# Patient Record
Sex: Male | Born: 1970 | Race: White | Hispanic: No | Marital: Married
Health system: Southern US, Community
[De-identification: ages and names within clinical notes are randomized; demographics above are authoritative.]

---

## 1999-03-01 ENCOUNTER — Emergency Department (HOSPITAL_COMMUNITY): Admission: EM | Admit: 1999-03-01 | Discharge: 1999-03-01 | Payer: Self-pay | Admitting: Emergency Medicine

## 1999-03-23 ENCOUNTER — Encounter: Admission: RE | Admit: 1999-03-23 | Discharge: 1999-06-21 | Payer: Self-pay

## 2009-11-16 ENCOUNTER — Emergency Department (HOSPITAL_BASED_OUTPATIENT_CLINIC_OR_DEPARTMENT_OTHER): Admission: EM | Admit: 2009-11-16 | Discharge: 2009-11-16 | Payer: Self-pay | Admitting: Emergency Medicine

## 2009-11-16 ENCOUNTER — Ambulatory Visit: Payer: Self-pay | Admitting: Diagnostic Radiology

## 2010-10-11 LAB — COMPREHENSIVE METABOLIC PANEL
ALT: 25 U/L (ref 0–53)
AST: 29 U/L (ref 0–37)
CO2: 27 mEq/L (ref 19–32)
Chloride: 99 mEq/L (ref 96–112)
GFR calc non Af Amer: >60 mL/min (ref >60)
Potassium: 4.7 mEq/L (ref 3.5–5.1)
Sodium: 139 mEq/L (ref 135–145)
Total Bilirubin: 1 mg/dL (ref 0.3–1.2)
Total Protein: 8.1 g/dL (ref 6.0–8.3)

## 2010-10-11 LAB — CBC
HCT: 42.9 % (ref 39.0–52.0)
Hemoglobin: 14.5 g/dL (ref 13.0–17.0)
MCHC: 33.9 g/dL (ref 30.0–36.0)
RBC: 4.86 MIL/uL (ref 4.22–5.81)

## 2010-10-11 LAB — URINALYSIS, ROUTINE W REFLEX MICROSCOPIC
Bilirubin Urine: NEGATIVE
Ketones, ur: 15 mg/dL — AB
Nitrite: NEGATIVE
Protein, ur: NEGATIVE mg/dL
Specific Gravity, Urine: 1.031 — ABNORMAL HIGH (ref 1.005–1.030)
pH: 6.5 (ref 5.0–8.0)

## 2010-10-11 LAB — GLUCOSE, CAPILLARY: Glucose-Capillary: 177 mg/dL — ABNORMAL HIGH (ref 70–99)

## 2010-10-11 LAB — DIFFERENTIAL
Eosinophils Absolute: 0 10*3/uL (ref 0.0–0.7)
Eosinophils Relative: 0 % (ref 0–5)
Lymphocytes Relative: 8 % — ABNORMAL LOW (ref 12–46)
Lymphs Abs: 0.8 10*3/uL (ref 0.7–4.0)
Monocytes Absolute: 0.7 10*3/uL (ref 0.1–1.0)
Monocytes Relative: 8 % (ref 3–12)

## 2010-10-11 LAB — LIPASE, BLOOD: Lipase: 56 U/L (ref 23–300)

## 2011-01-28 IMAGING — CT CT ABD-PELV W/ CM
2 of 4 series · 17 of 46 positions shown, 19 images · IV contrast (APPLIED)
Comparison: None.

CLINICAL DATA: Right upper quadrant abdominal pain.  Nausea
vomiting.  History hypertension and diabetes.

CT ABDOMEN AND PELVIS WITH CONTRAST
TECHNIQUE: Multidetector CT imaging of the abdomen and pelvis was
performed following the standard protocol during bolus
administration of intravenous contrast.
Contrast: 100  ml [id number]

[Series 2: abd/pelvis 5.0 b31f · axial · 0.80mm/px · z∈[-438,+7]mm · 14 of 99 slices shown, 16 images]
[im 5/99  soft-tissue]
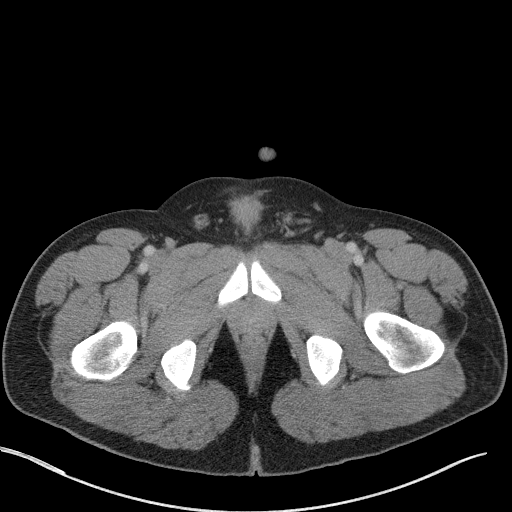
[im 5/99  bone]
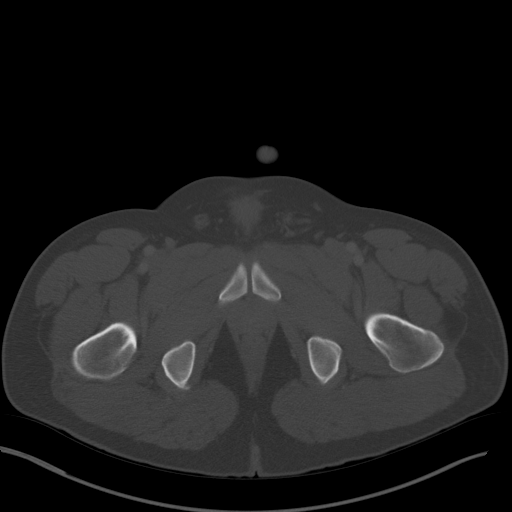
[im 13/99  soft-tissue]
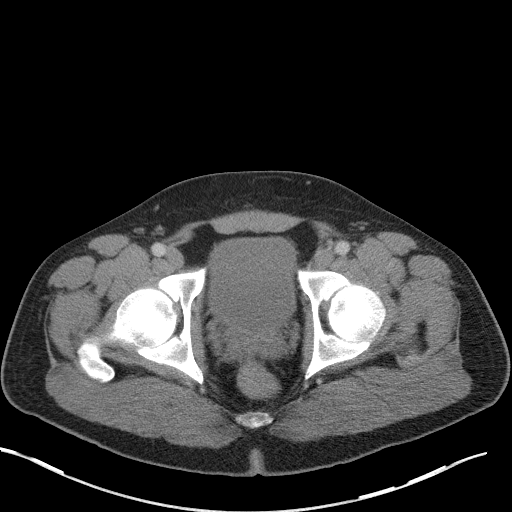
[im 18/99  soft-tissue]
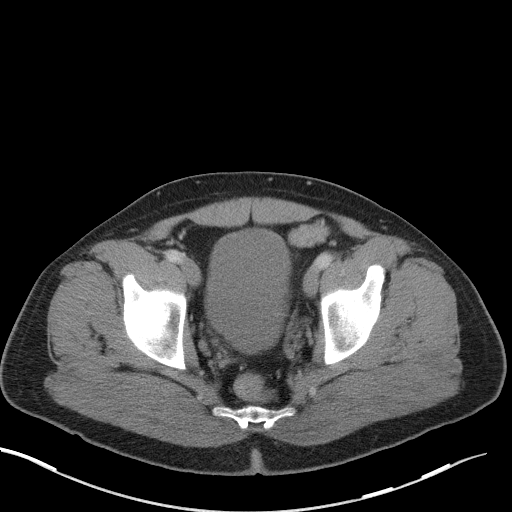
[im 26/99  soft-tissue]
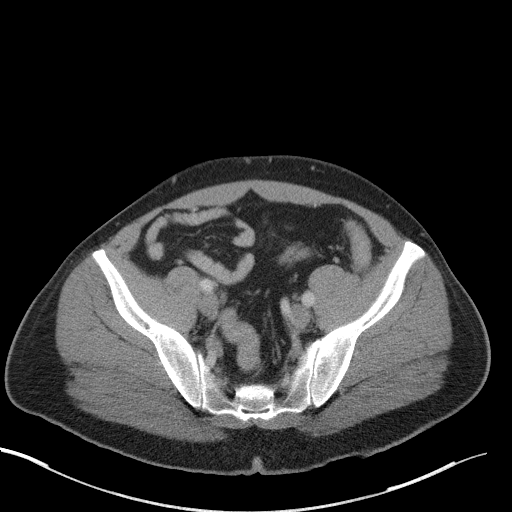
[im 35/99  soft-tissue]
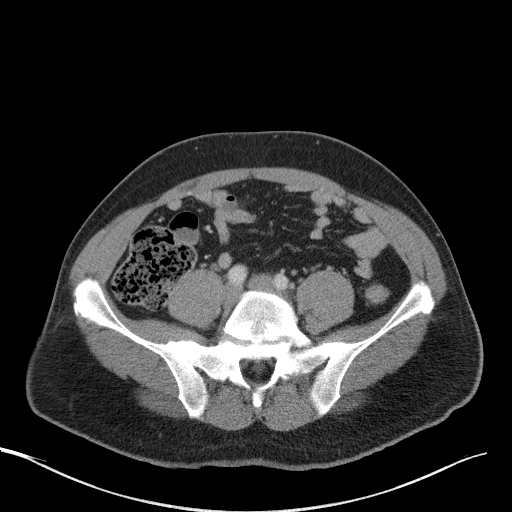
[im 39/99  soft-tissue]
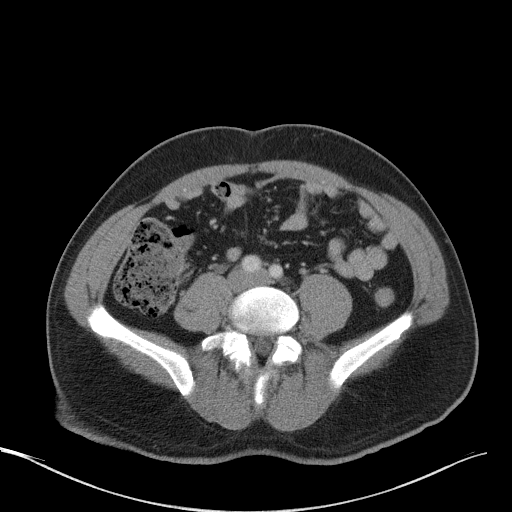
[im 47/99  soft-tissue]
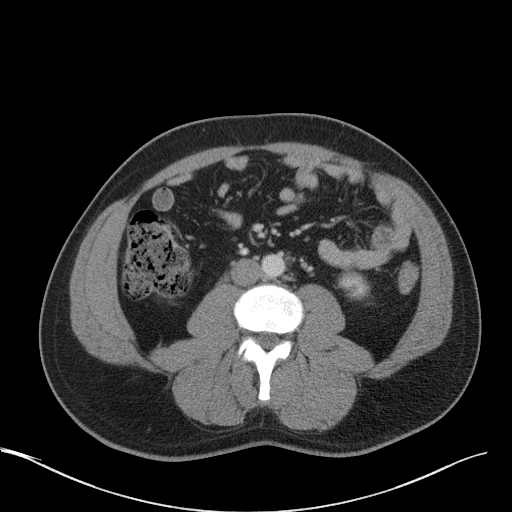
[im 52/99  soft-tissue]
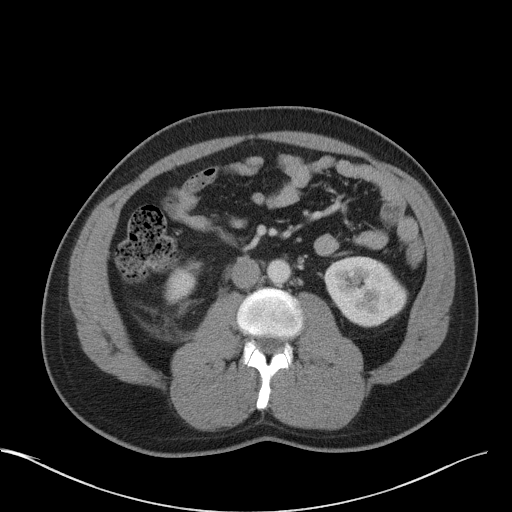
[im 60/99  soft-tissue]
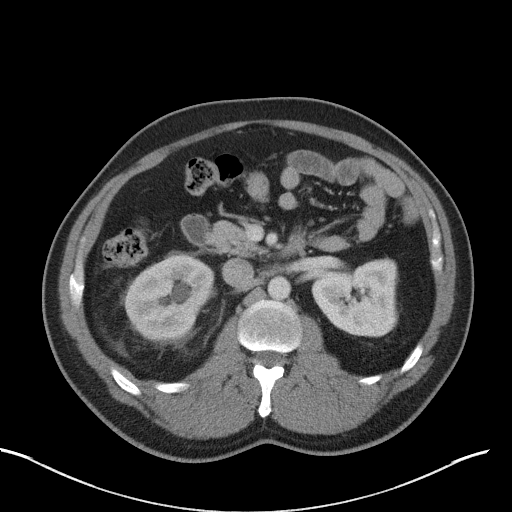
[im 60/99  bone]
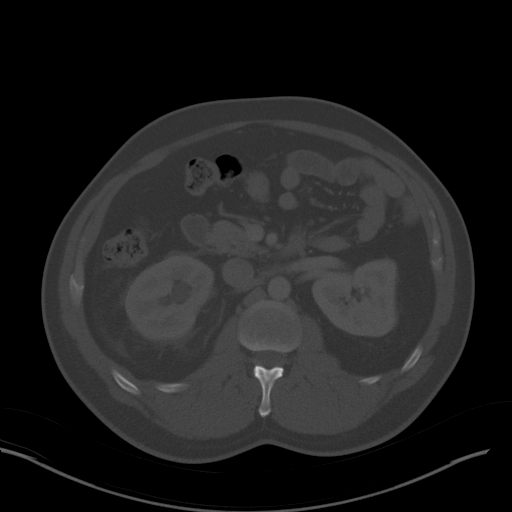
[im 64/99  soft-tissue]
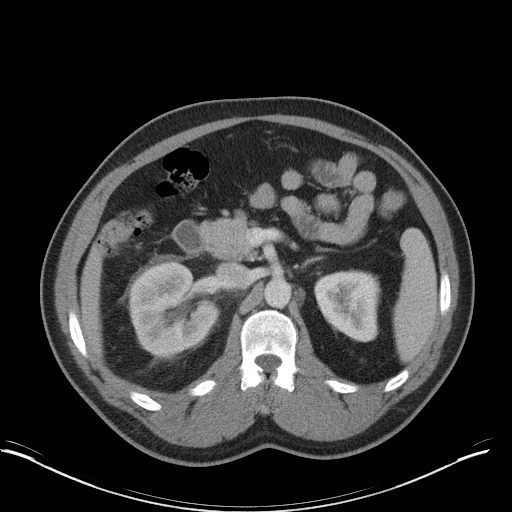
[im 73/99  soft-tissue]
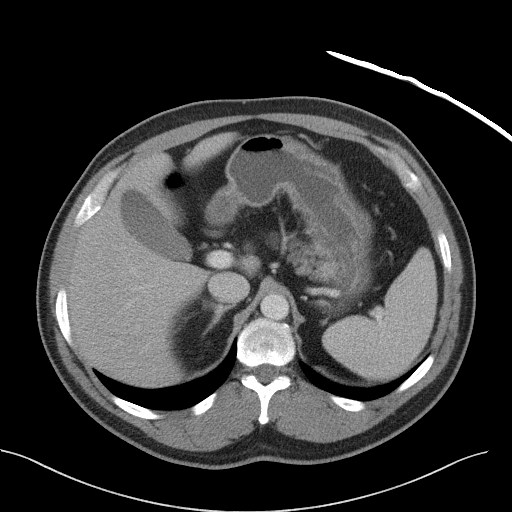
[im 81/99  soft-tissue]
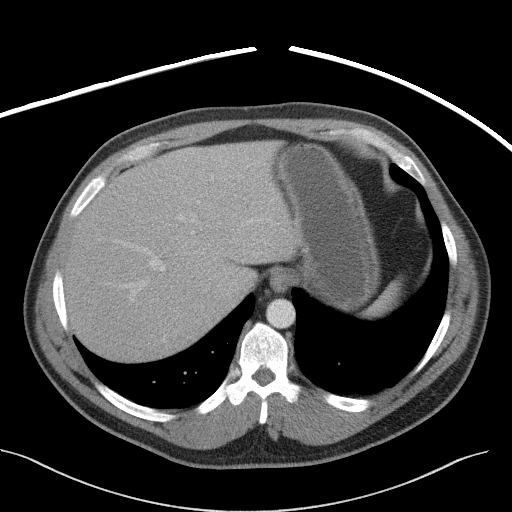
[im 86/99  soft-tissue]
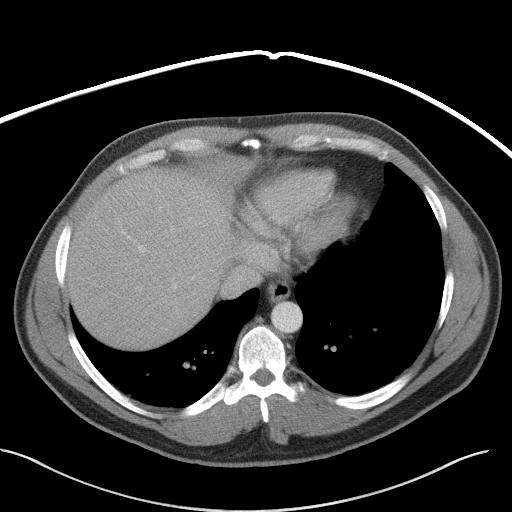
[im 94/99  soft-tissue]
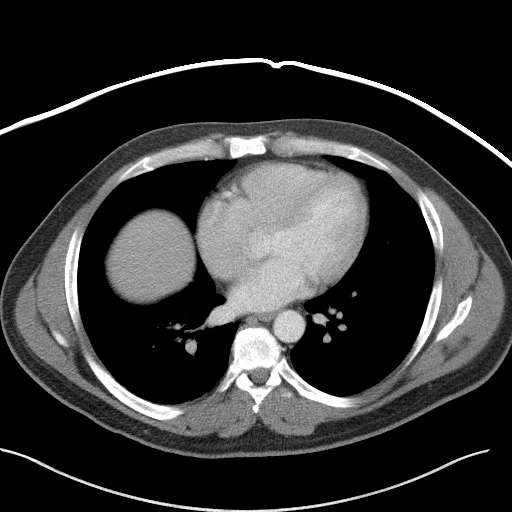

[Series 5: abd/pelvis 3.0 coronal · coronal · 0.88mm/px · 3 of 92 slices shown]
[im 31/92  soft-tissue]
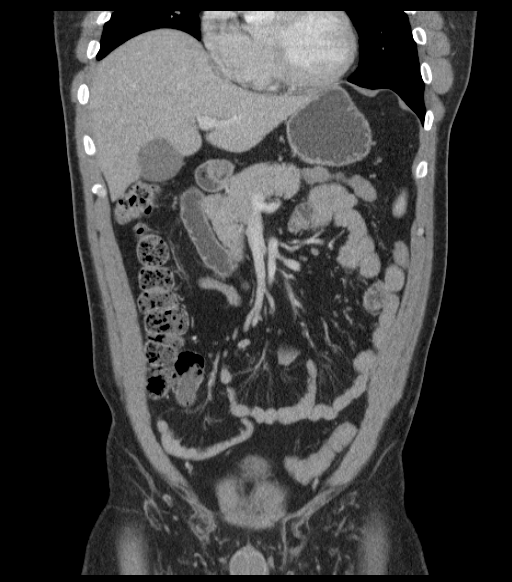
[im 41/92  soft-tissue]
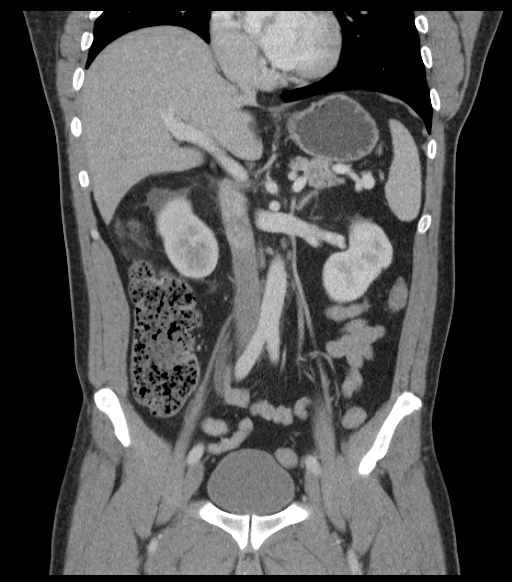
[im 51/92  soft-tissue]
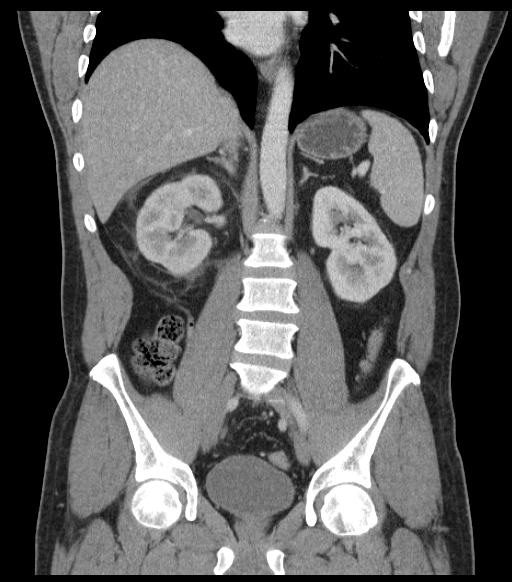

[17 of 46 positions shown; findings below may reference images not displayed]

FINDINGS: Minimal motion artifact at the lung bases. Normal heart
size without pericardial or pleural effusion.  Normal liver,
spleen, stomach, pancreas, gallbladder, biliary tract, adrenal
glands, left kidney.

Moderate obstructive signs involve the right kidney with perirenal
edema and mild to moderate right hydroureter.  This continues to
the level of a 1-2 mm right ureterovesicular junction calculus on
axial image 85 and coronal image 59.

No retroperitoneal or retrocrural adenopathy.

Normal colon and terminal ileum.  No pelvic adenopathy.    Normal
prostate without significant free fluid.  Bilateral pars defects at
L5 with age advanced degenerative disc disease and 8 mm of
anterolisthesis of L5 on S1.

Punctate right lower pole renal calculus identified on coronal
image 48.
IMPRESSION: 1.  Obstructive 1-2 mm right ureterovesicular junction calculus.
2.  Probable lower pole right renal calculus.
3.  L5 pars defects with 8 mm of anterolisthesis of L5 on S1.

## 2015-04-19 ENCOUNTER — Telehealth: Payer: Self-pay | Admitting: *Deleted

## 2015-04-19 NOTE — Telephone Encounter (Signed)
opened in error
